# Patient Record
Sex: Male | Born: 1985 | Race: Black or African American | Hispanic: No | Marital: Single | Smoking: Never smoker
Health system: Southern US, Community
[De-identification: ages and names within clinical notes are randomized; demographics above are authoritative.]

## PROBLEM LIST (undated history)

## (undated) DIAGNOSIS — I10 Essential (primary) hypertension: Secondary | ICD-10-CM

## (undated) HISTORY — PX: FINGER SURGERY: SHX640

## (undated) HISTORY — PX: DENTAL SURGERY: SHX609

---

## 2014-09-28 ENCOUNTER — Emergency Department (HOSPITAL_COMMUNITY)
Admission: EM | Admit: 2014-09-28 | Discharge: 2014-09-28 | Disposition: A | Discharge status: Home / Self Care | Payer: BLUE CROSS/BLUE SHIELD | Attending: Emergency Medicine | Admitting: Emergency Medicine

## 2014-09-28 ENCOUNTER — Encounter (HOSPITAL_COMMUNITY): Payer: Self-pay

## 2014-09-28 ENCOUNTER — Emergency Department (HOSPITAL_COMMUNITY): Payer: BLUE CROSS/BLUE SHIELD

## 2014-09-28 DIAGNOSIS — M79671 Pain in right foot: Secondary | ICD-10-CM | POA: Diagnosis present

## 2014-09-28 DIAGNOSIS — M722 Plantar fascial fibromatosis: Secondary | ICD-10-CM | POA: Diagnosis not present

## 2014-09-28 DIAGNOSIS — Z79899 Other long term (current) drug therapy: Secondary | ICD-10-CM | POA: Insufficient documentation

## 2014-09-28 MED ORDER — NAPROXEN 500 MG PO TABS: 500.0000 mg | ORAL_TABLET | Freq: Two times a day (BID) | ORAL | Status: AC

## 2014-09-28 NOTE — Discharge Instructions (Signed)
Plantar Fasciitis (Heel Spur Syndrome) with Rehab The plantar fascia is a fibrous, ligament-like, soft-tissue structure that spans the bottom of the foot. Plantar fasciitis is a condition that causes pain in the foot due to inflammation of the tissue. SYMPTOMS   Pain and tenderness on the underneath side of the foot.  Pain that worsens with standing or walking. CAUSES  Plantar fasciitis is caused by irritation and injury to the plantar fascia on the underneath side of the foot. Common mechanisms of injury include:  Direct trauma to bottom of the foot.  Damage to a small nerve that runs under the foot where the main fascia attaches to the heel bone.  Stress placed on the plantar fascia due to bone spurs. RISK INCREASES WITH:   Activities that place stress on the plantar fascia (running, jumping, pivoting, or cutting).  Poor strength and flexibility.  Improperly fitted shoes.  Tight calf muscles.  Flat feet.  Failure to warm-up properly before activity.  Obesity. PREVENTION  Warm up and stretch properly before activity.  Allow for adequate recovery between workouts.  Maintain physical fitness:  Strength, flexibility, and endurance.  Cardiovascular fitness.  Maintain a health body weight.  Avoid stress on the plantar fascia.  Wear properly fitted shoes, including arch supports for individuals who have flat feet. PROGNOSIS  If treated properly, then the symptoms of plantar fasciitis usually resolve without surgery. However, occasionally surgery is necessary. RELATED COMPLICATIONS   Recurrent symptoms that may result in a chronic condition.  Problems of the lower back that are caused by compensating for the injury, such as limping.  Pain or weakness of the foot during push-off following surgery.  Chronic inflammation, scarring, and partial or complete fascia tear, occurring more often from repeated injections. TREATMENT  Treatment initially involves the use of  ice and medication to help reduce pain and inflammation. The use of strengthening and stretching exercises may help reduce pain with activity, especially stretches of the Achilles tendon. These exercises may be performed at home or with a therapist. Your caregiver may recommend that you use heel cups of arch supports to help reduce stress on the plantar fascia. Occasionally, corticosteroid injections are given to reduce inflammation. If symptoms persist for greater than 6 months despite non-surgical (conservative), then surgery may be recommended.  MEDICATION   If pain medication is necessary, then nonsteroidal anti-inflammatory medications, such as aspirin and ibuprofen, or other minor pain relievers, such as acetaminophen, are often recommended.  Do not take pain medication within 7 days before surgery.  Prescription pain relievers may be given if deemed necessary by your caregiver. Use only as directed and only as much as you need.  Corticosteroid injections may be given by your caregiver. These injections should be reserved for the most serious cases, because they may only be given a certain number of times. HEAT AND COLD  Cold treatment (icing) relieves pain and reduces inflammation. Cold treatment should be applied for 10 to 15 minutes every 2 to 3 hours for inflammation and pain and immediately after any activity that aggravates your symptoms. Use ice packs or massage the area with a piece of ice (ice massage).  Heat treatment may be used prior to performing the stretching and strengthening activities prescribed by your caregiver, physical therapist, or athletic trainer. Use a heat pack or soak the injury in warm water. SEEK IMMEDIATE MEDICAL CARE IF:  Treatment seems to offer no benefit, or the condition worsens.  Any medications produce adverse side effects. EXERCISES RANGE  OF MOTION (ROM) AND STRETCHING EXERCISES - Plantar Fasciitis (Heel Spur Syndrome) These exercises may help you  when beginning to rehabilitate your injury. Your symptoms may resolve with or without further involvement from your physician, physical therapist or athletic trainer. While completing these exercises, remember:   Restoring tissue flexibility helps normal motion to return to the joints. This allows healthier, less painful movement and activity.  An effective stretch should be held for at least 30 seconds.  A stretch should never be painful. You should only feel a gentle lengthening or release in the stretched tissue. RANGE OF MOTION - Toe Extension, Flexion  Sit with your right / left leg crossed over your opposite knee.  Grasp your toes and gently pull them back toward the top of your foot. You should feel a stretch on the bottom of your toes and/or foot.  Hold this stretch for __________ seconds.  Now, gently pull your toes toward the bottom of your foot. You should feel a stretch on the top of your toes and or foot.  Hold this stretch for __________ seconds. Repeat __________ times. Complete this stretch __________ times per day.  RANGE OF MOTION - Ankle Dorsiflexion, Active Assisted  Remove shoes and sit on a chair that is preferably not on a carpeted surface.  Place right / left foot under knee. Extend your opposite leg for support.  Keeping your heel down, slide your right / left foot back toward the chair until you feel a stretch at your ankle or calf. If you do not feel a stretch, slide your bottom forward to the edge of the chair, while still keeping your heel down.  Hold this stretch for __________ seconds. Repeat __________ times. Complete this stretch __________ times per day.  STRETCH - Gastroc, Standing  Place hands on wall.  Extend right / left leg, keeping the front knee somewhat bent.  Slightly point your toes inward on your back foot.  Keeping your right / left heel on the floor and your knee straight, shift your weight toward the wall, not allowing your back to  arch.  You should feel a gentle stretch in the right / left calf. Hold this position for __________ seconds. Repeat __________ times. Complete this stretch __________ times per day.

## 2014-09-28 NOTE — ED Notes (Signed)
Pt reports to ED w/ c/o of R heel pain.  Pt reports that he was playing basket ball when pain started. Pt reports that he had previous foot injury but never got it checked out. Pt denies taking anything for pain.

## 2014-09-28 NOTE — ED Provider Notes (Signed)
CSN: [id number]     Arrival date & time 09/28/14  0830 History   First MD Initiated Contact with Patient 09/28/14 [phone]     Chief Complaint  Patient presents with  . Foot Pain     (Consider location/radiation/quality/duration/timing/severity/associated sxs/prior Treatment) The history is provided by the patient.   Erik Blankenship is a 29 y.o. male presenting with acute pain in his right heel which started yesterday while playing basketball.  He describes planting his foot and had sudden onset of sharp pain in his heel which has been persistent.  He reports prior episodes of mild discomfort at the site which was self limited and resolved without difficulty or need for evaluation.  He denies weakness or numbness in his foot or toes.  He does have pain with full weightbearing on this heel.  He has applied ice to the site, has taken no medications for this condition prior to arrival.     History reviewed. No pertinent past medical history. Past Surgical History  Procedure Laterality Date  . Dental surgery    . Finger surgery     History reviewed. No pertinent family history. History  Substance Use Topics  . Smoking status: Never Smoker   . Smokeless tobacco: Not on file  . Alcohol Use: 0.6 oz/week    1 Cans of beer per week    Review of Systems  Constitutional: Negative for fever.  Musculoskeletal: Positive for arthralgias. Negative for myalgias and joint swelling.  Neurological: Negative for weakness and numbness.      Allergies  Review of patient's allergies indicates no known allergies.  Home Medications   Prior to Admission medications   Medication Sig Start Date End Date Taking? Authorizing Provider  lisinopril (PRINIVIL,ZESTRIL) 10 MG tablet Take 10 mg by mouth daily.   Yes Historical Provider, MD  naproxen (NAPROSYN) 500 MG tablet Take 1 tablet (500 mg total) by mouth 2 (two) times daily. 09/28/14   Burgess AmorJulie Viki Carrera, PA-C   BP 160/97 mmHg  Pulse 87  Temp(Src) 98.1 F (36.7  C) (Oral)  Resp 16  Ht 5\' 10"  (1.778 m)  Wt 195 lb (88.451 kg)  BMI 27.98 kg/m2  SpO2 98% Physical Exam  Constitutional: He appears well-developed and well-nourished.  HENT:  Head: Atraumatic.  Neck: Normal range of motion.  Cardiovascular:  Pulses equal bilaterally  Musculoskeletal: He exhibits tenderness.       Feet:  Tentative palpation with subtle edema noted at the medial aspect of his plantar calcaneus.  Pain is increased with flexion of his toes.  Dorsalis pedis pulses are intact, no other foot edema noted.  Less than 2 second cap refill in toes.  Achilles tendon is nontender and without disruption.  Neurological: He is alert. He has normal strength. He displays normal reflexes. No sensory deficit.  Skin: Skin is warm and dry.  Psychiatric: He has a normal mood and affect.    ED Course  Procedures (including critical care time) Labs Review Labs Reviewed - No data to display  Imaging Review Dg Foot Complete Right  09/28/2014   CLINICAL DATA:  Right foot injury playing basketball 09/27/2014. Pain. Initial encounter.  EXAM: RIGHT FOOT COMPLETE - 3+ VIEW  COMPARISON:  None.  FINDINGS: Imaged bones, joints and soft tissues appear normal.  IMPRESSION: Negative exam.   Electronically Signed   By: Drusilla Kannerhomas  Dalessio M.D.   On: 09/28/2014 09:10     EKG Interpretation None      MDM   Final diagnoses:  Plantar fasciitis of right foot    Patients labs and/or radiological studies were reviewed and considered during the medical decision making and disposition process.  Results were also discussed with patient. Patient was prescribed naproxen, discussed ice for the next several days, then can employ contrast baths.  He was given instructions on stretching exercises to help eliminate this problem.  Advised to purchase a shoe insert designed for this condition to take the pressure off of the site.  He was given referral to Dr. Romeo Apple for further evaluation if today's treatment does  not improve his symptoms.  The patient appears reasonably screened and/or stabilized for discharge and I doubt any other medical condition or other [facility] requiring further screening, evaluation, or treatment in the ED at this time prior to discharge.     Burgess Amor, PA-C 09/28/14 8119  Donnetta Hutching, MD 09/28/14 1031

## 2014-09-28 NOTE — ED Notes (Signed)
Patient with no complaints at this time. Respirations even and unlabored. Skin warm/dry. Discharge instructions reviewed with patient at this time. Patient given opportunity to voice concerns/ask questions. Patient discharged at this time and left Emergency Department with steady gait.   

## 2014-11-09 ENCOUNTER — Emergency Department (HOSPITAL_COMMUNITY)
Admission: EM | Admit: 2014-11-09 | Discharge: 2014-11-09 | Disposition: A | Discharge status: Home / Self Care | Payer: BLUE CROSS/BLUE SHIELD | Attending: Emergency Medicine | Admitting: Emergency Medicine

## 2014-11-09 ENCOUNTER — Encounter (HOSPITAL_COMMUNITY): Payer: Self-pay | Admitting: *Deleted

## 2014-11-09 DIAGNOSIS — Z79899 Other long term (current) drug therapy: Secondary | ICD-10-CM | POA: Diagnosis not present

## 2014-11-09 DIAGNOSIS — J039 Acute tonsillitis, unspecified: Secondary | ICD-10-CM | POA: Diagnosis not present

## 2014-11-09 DIAGNOSIS — I10 Essential (primary) hypertension: Secondary | ICD-10-CM | POA: Insufficient documentation

## 2014-11-09 DIAGNOSIS — Z791 Long term (current) use of non-steroidal anti-inflammatories (NSAID): Secondary | ICD-10-CM | POA: Diagnosis not present

## 2014-11-09 DIAGNOSIS — J029 Acute pharyngitis, unspecified: Secondary | ICD-10-CM | POA: Diagnosis present

## 2014-11-09 HISTORY — DX: Essential (primary) hypertension: I10

## 2014-11-09 MED ORDER — MAGIC MOUTHWASH W/LIDOCAINE: 5.0000 mL | Freq: Three times a day (TID) | ORAL | Status: AC | PRN

## 2014-11-09 MED ORDER — AMOXICILLIN 500 MG PO CAPS: 500.0000 mg | ORAL_CAPSULE | Freq: Three times a day (TID) | ORAL | Status: AC

## 2014-11-09 NOTE — Discharge Instructions (Signed)
Tonsillitis °Tonsillitis is an infection of the throat. This infection causes the tonsils to become red, tender, and puffy (swollen). Tonsils are groups of tissue at the back of your throat. If bacteria caused your infection, antibiotic medicine will be given to you. Sometimes symptoms of tonsillitis can be relieved with the use of steroid medicine. If your tonsillitis is severe and happens often, you may need to get your tonsils removed (tonsillectomy). °HOME CARE  °· Rest and sleep often. °· Drink enough fluids to keep your pee (urine) clear or pale yellow. °· While your throat is sore, eat soft or liquid foods like: °¨ Soup. °¨ Ice cream. °¨ Instant breakfast drinks. °· Eat frozen ice pops. °· Gargle with a warm or cold liquid to help soothe the throat. Gargle with a water and salt mix. Mix 1/4 teaspoon of salt and 1/4 teaspoon of baking soda in 1 cup of water. °· Only take medicines as told by your doctor. °· If you are given medicines (antibiotics), take them as told. Finish them even if you start to feel better. °GET HELP IF: °· You have large, tender lumps in your neck. °· You have a rash. °· You cough up green, yellow-brown, or bloody fluid. °· You cannot swallow liquids or food for 24 hours. °· You notice that only one of your tonsils is swollen. °GET HELP RIGHT AWAY IF:  °· You throw up (vomit). °· You have a very bad headache. °· You have a stiff neck. °· You have chest pain. °· You have trouble breathing or swallowing. °· You have bad throat pain, drooling, or your voice changes. °· You have bad pain not helped by medicine. °· You cannot fully open your mouth. °· You have redness, puffiness, or bad pain in the neck. °· You have a fever. °MAKE SURE YOU:  °· Understand these instructions. °· Will watch your condition. °· Will get help right away if you are not doing well or get worse. °Document Released: 11/26/2007 Document Revised: 06/14/2013 Document Reviewed: 11/26/2012 °[employer]® Patient Information  ©2015 [employer]. This information is not intended to replace advice given to you by your health care provider. Make sure you discuss any questions you have with your health care provider. ° °

## 2014-11-09 NOTE — ED Provider Notes (Signed)
CSN: [id number]     Arrival date & time 11/09/14  1125 History   First MD Initiated Contact with Patient 11/09/14 1139     Chief Complaint  Patient presents with  . Sore Throat     (Consider location/radiation/quality/duration/timing/severity/associated sxs/prior Treatment) HPI   Erik Blankenship is a 29 y.o. male who presents to the Emergency Department complaining of sore throat for 2 weeks.  He states he developed "cold symptoms" initially, but later congestion and runny nose improved after a week , but sore throat continues.  He has used OTC throat spray.  He denies fever, neck pain, ear pain or vomiting.     Past Medical History  Diagnosis Date  . Hypertension    Past Surgical History  Procedure Laterality Date  . Dental surgery    . Finger surgery     History reviewed. No pertinent family history. History  Substance Use Topics  . Smoking status: Never Smoker   . Smokeless tobacco: Not on file  . Alcohol Use: 0.6 oz/week    1 Cans of beer per week    Review of Systems  Constitutional: Negative for fever, chills, activity change and appetite change.  HENT: Positive for sore throat. Negative for congestion, ear pain, facial swelling, trouble swallowing and voice change.   Eyes: Negative for pain and visual disturbance.  Respiratory: Negative for cough and shortness of breath.   Gastrointestinal: Negative for nausea, vomiting and abdominal pain.  Musculoskeletal: Negative for arthralgias, neck pain and neck stiffness.  Skin: Negative for color change and rash.  Neurological: Negative for dizziness, facial asymmetry, speech difficulty, numbness and headaches.  Hematological: Negative for adenopathy.  All other systems reviewed and are negative.     Allergies  Review of patient's allergies indicates no known allergies.  Home Medications   Prior to Admission medications   Medication Sig Start Date End Date Taking? Authorizing Provider  Alum & Mag Hydroxide-Simeth  (MAGIC MOUTHWASH W/LIDOCAINE) SOLN Take 5 mLs by mouth 3 (three) times daily as needed for mouth pain. Swish and spit, do not swallow 11/09/14   Eileene Kisling, PA-C  amoxicillin (AMOXIL) 500 MG capsule Take 1 capsule (500 mg total) by mouth 3 (three) times daily. For 10 days 11/09/14   Agripina Guyette, PA-C  lisinopril (PRINIVIL,ZESTRIL) 10 MG tablet Take 10 mg by mouth daily.    Historical Provider, MD  naproxen (NAPROSYN) 500 MG tablet Take 1 tablet (500 mg total) by mouth 2 (two) times daily. 09/28/14   Burgess AmorJulie Idol, PA-C   BP 162/104 mmHg  Pulse 77  Temp(Src) 97.8 F (36.6 C) (Oral)  Resp 15  Ht 5\' 10"  (1.778 m)  Wt 195 lb (88.451 kg)  BMI 27.98 kg/m2  SpO2 100% Physical Exam  Constitutional: He is oriented to person, place, and time. He appears well-developed and well-nourished. No distress.  HENT:  Head: Normocephalic and atraumatic.  Right Ear: Tympanic membrane and ear canal normal.  Left Ear: Tympanic membrane and ear canal normal.  Mouth/Throat: Uvula is midline and mucous membranes are normal. No trismus in the jaw. No uvula swelling. Oropharyngeal exudate, posterior oropharyngeal edema and posterior oropharyngeal erythema present. No tonsillar abscesses.  Neck: Normal range of motion. Neck supple.  Cardiovascular: Normal rate, regular rhythm and normal heart sounds.   Pulmonary/Chest: Effort normal and breath sounds normal.  Abdominal: There is no splenomegaly. There is no tenderness.  Musculoskeletal: Normal range of motion.  Lymphadenopathy:    He has no cervical adenopathy.  Neurological: He is  alert and oriented to person, place, and time. He exhibits normal muscle tone. Coordination normal.  Skin: Skin is warm and dry.  Nursing note and vitals reviewed.   ED Course  Procedures (including critical care time) Labs Review Labs Reviewed - No data to display  Imaging Review No results found.   EKG Interpretation None      MDM   Final diagnoses:  Tonsillitis     Pt is well appearing.  Non-toxic.  BP improved on recheck, but still hypertensive.  No sx's.  Pt did not take his BP medication this morning advised to take it when he gets home.  Agrees to return for any worsening sx's  Pauline Ausammy Jihan Mellette, PA-C 11/11/14 1605  Rolland PorterMark James, MD 11/18/14 [phone]

## 2014-11-09 NOTE — ED Notes (Signed)
Sore throat for 2 weeks,

## 2015-08-24 IMAGING — DX DG FOOT COMPLETE 3+V*R*
3 series · 3 of 3 positions shown · non-contrast
Comparison: None.

CLINICAL DATA: Right foot injury playing basketball 09/27/2014.
Pain. Initial encounter.

EXAM:
RIGHT FOOT COMPLETE - 3+ VIEW

[foot ap]
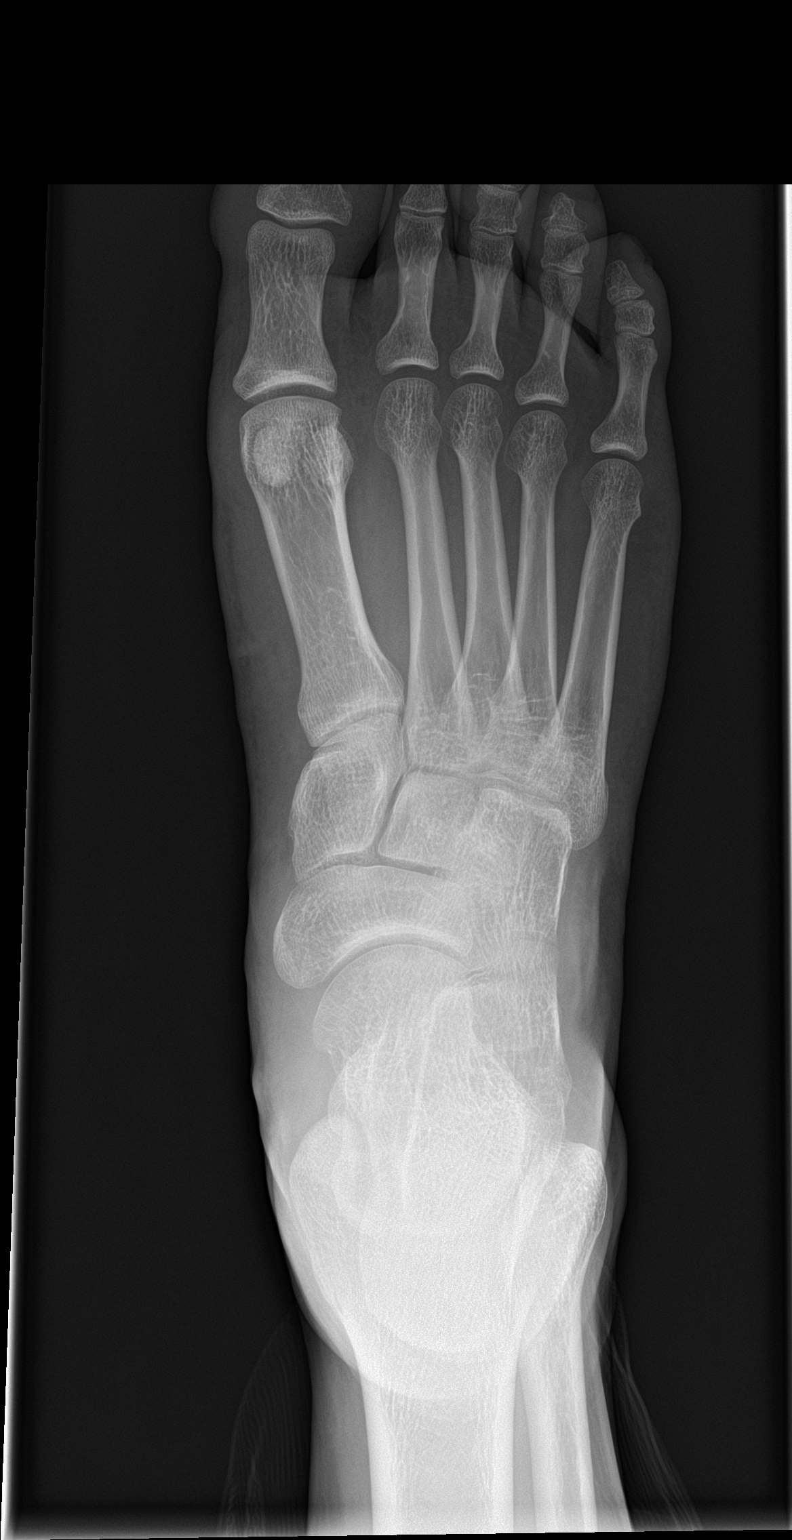

[foot obl]
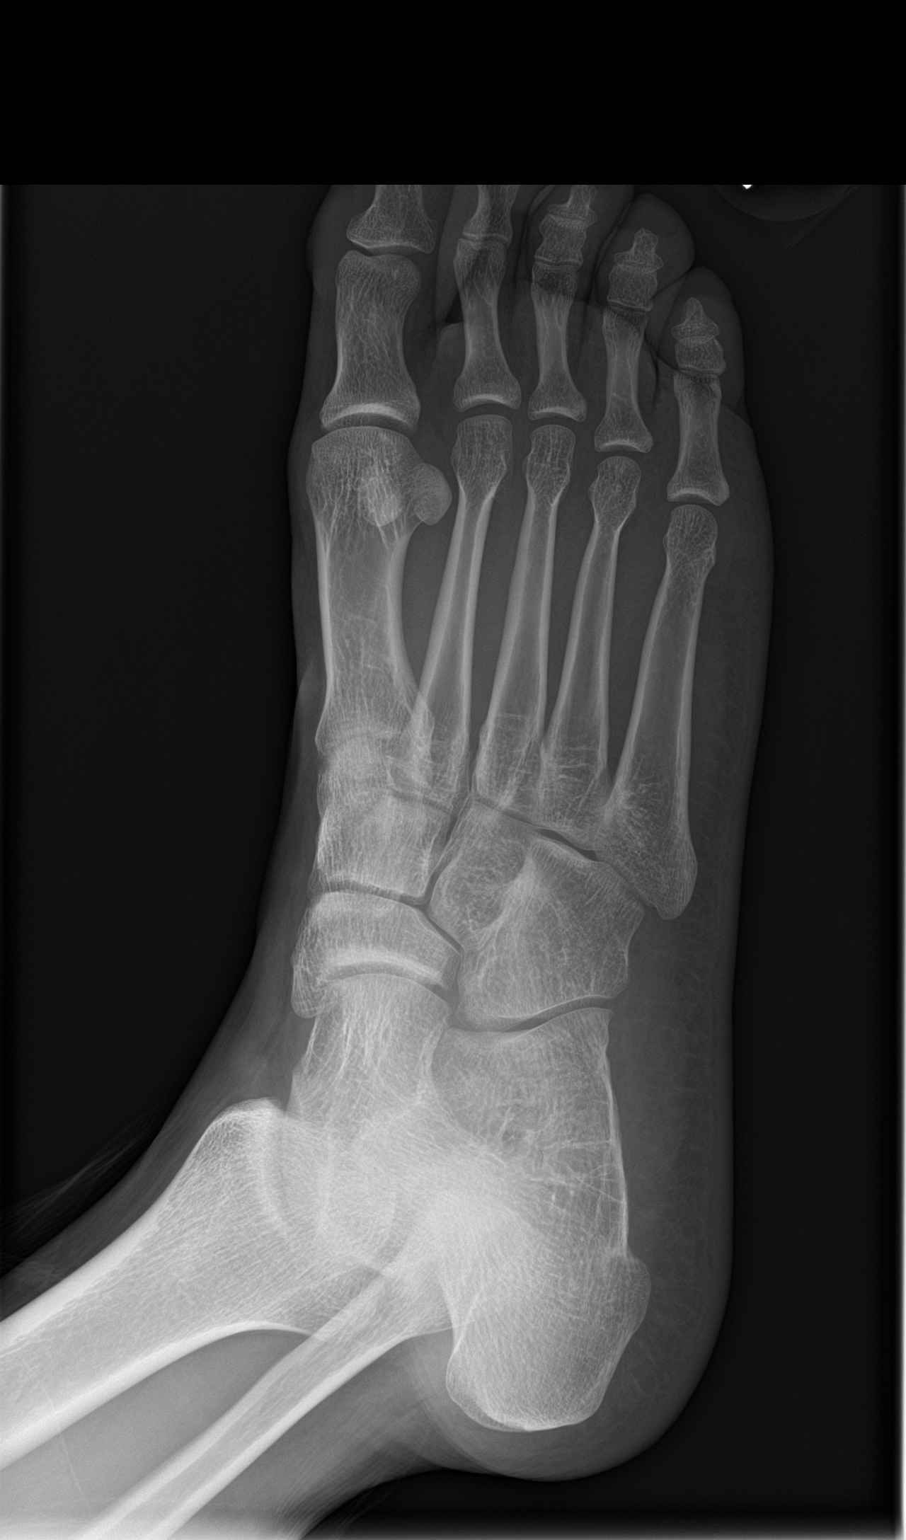

[foot lat]
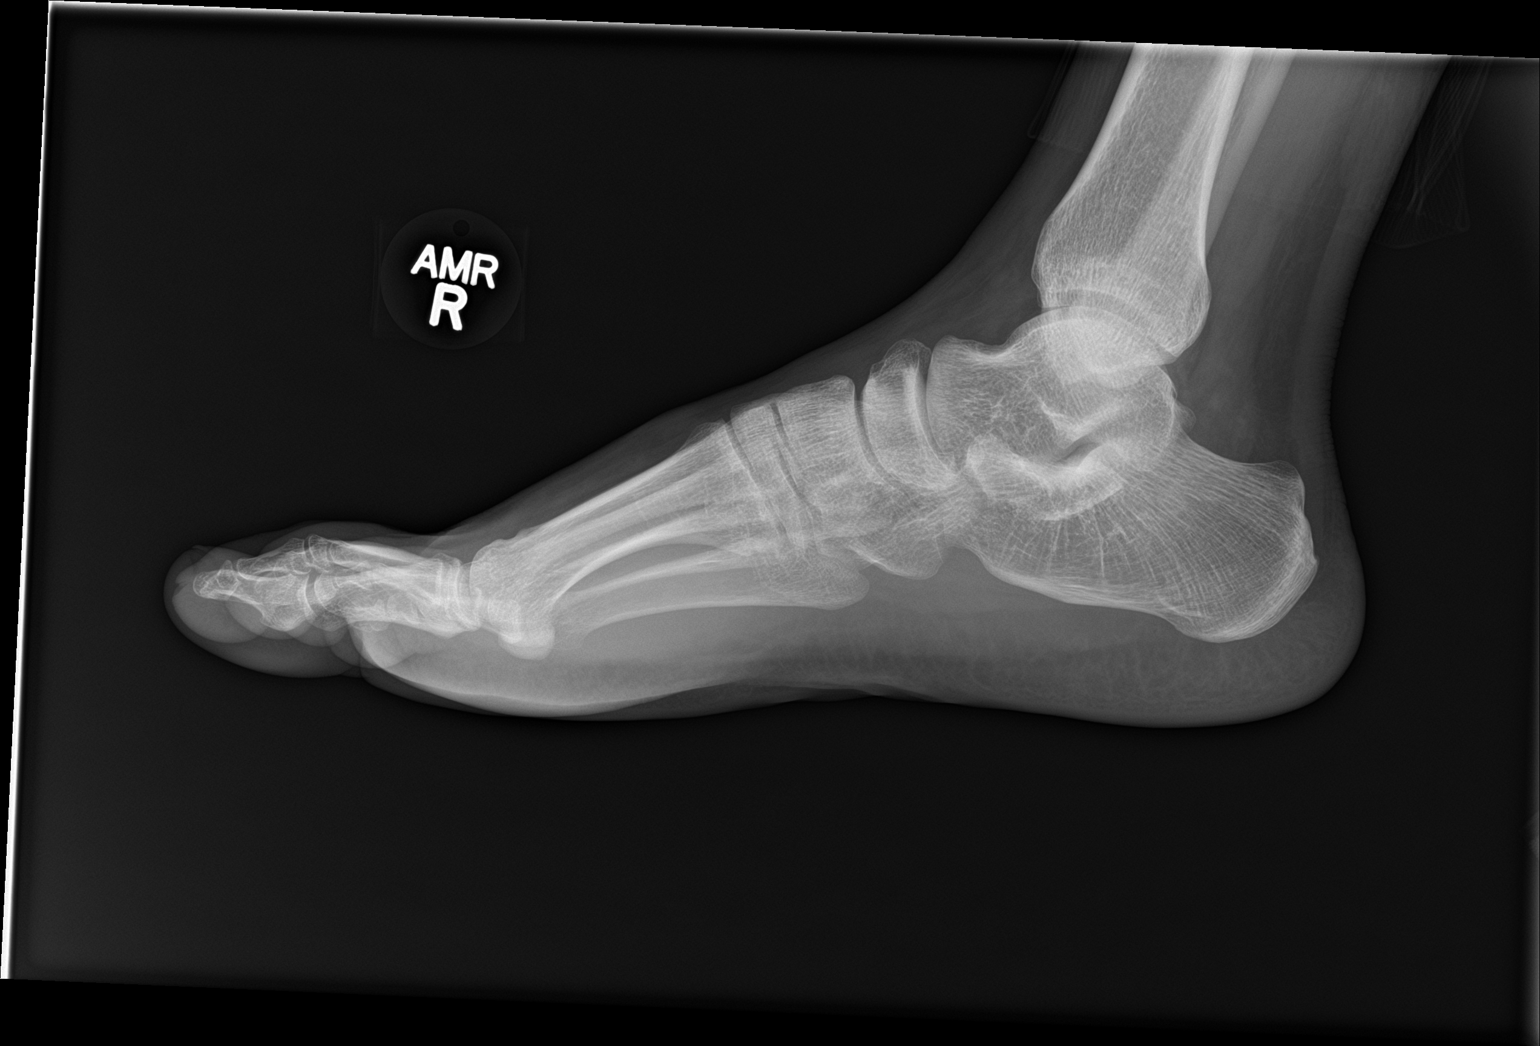

[3 of 3 positions shown; findings below may reference images not displayed]

FINDINGS: Imaged bones, joints and soft tissues appear normal.
IMPRESSION: Negative exam.

## 2015-11-18 ENCOUNTER — Emergency Department (HOSPITAL_COMMUNITY): Payer: BLUE CROSS/BLUE SHIELD

## 2015-11-18 ENCOUNTER — Emergency Department (HOSPITAL_COMMUNITY)
Admission: EM | Admit: 2015-11-18 | Discharge: 2015-11-18 | Disposition: A | Discharge status: Home / Self Care | Payer: BLUE CROSS/BLUE SHIELD | Attending: Emergency Medicine | Admitting: Emergency Medicine

## 2015-11-18 ENCOUNTER — Encounter (HOSPITAL_COMMUNITY): Payer: Self-pay | Admitting: Emergency Medicine

## 2015-11-18 DIAGNOSIS — J0100 Acute maxillary sinusitis, unspecified: Secondary | ICD-10-CM | POA: Insufficient documentation

## 2015-11-18 DIAGNOSIS — I10 Essential (primary) hypertension: Secondary | ICD-10-CM | POA: Insufficient documentation

## 2015-11-18 DIAGNOSIS — R51 Headache: Secondary | ICD-10-CM | POA: Diagnosis present

## 2015-11-18 DIAGNOSIS — J189 Pneumonia, unspecified organism: Secondary | ICD-10-CM | POA: Diagnosis not present

## 2015-11-18 DIAGNOSIS — Z79899 Other long term (current) drug therapy: Secondary | ICD-10-CM | POA: Insufficient documentation

## 2015-11-18 DIAGNOSIS — Z792 Long term (current) use of antibiotics: Secondary | ICD-10-CM | POA: Insufficient documentation

## 2015-11-18 MED ORDER — BENZONATATE 100 MG PO CAPS: 200.0000 mg | ORAL_CAPSULE | Freq: Three times a day (TID) | ORAL | Status: AC | PRN

## 2015-11-18 MED ORDER — BENZONATATE 100 MG PO CAPS
200.0000 mg | ORAL_CAPSULE | Freq: Once | ORAL | Status: AC
Start: 2015-11-18 — End: 2015-11-18
  Administered 2015-11-18: 200 mg via ORAL
  Filled 2015-11-18: qty 2

## 2015-11-18 MED ORDER — LEVOFLOXACIN 750 MG PO TABS
750.0000 mg | ORAL_TABLET | Freq: Once | ORAL | Status: AC
  Administered 2015-11-18: 750 mg via ORAL
  Filled 2015-11-18: qty 1

## 2015-11-18 MED ORDER — GUAIFENESIN-CODEINE 100-10 MG/5ML PO SOLN: 10.0000 mL | Freq: Four times a day (QID) | ORAL | Status: AC | PRN

## 2015-11-18 MED ORDER — LEVOFLOXACIN 750 MG PO TABS: 750.0000 mg | ORAL_TABLET | Freq: Every day | ORAL | Status: AC

## 2015-11-18 MED ORDER — ALBUTEROL SULFATE HFA 108 (90 BASE) MCG/ACT IN AERS
2.0000 | INHALATION_SPRAY | RESPIRATORY_TRACT | Status: DC | PRN
  Administered 2015-11-18: 2 via RESPIRATORY_TRACT
  Filled 2015-11-18: qty 6.7

## 2015-11-18 NOTE — Discharge Instructions (Signed)

## 2015-11-18 NOTE — ED Notes (Signed)
Pt reports facial pain, sore throat, fatigue and fever for the past two days.

## 2015-11-18 NOTE — ED Provider Notes (Signed)
CSN: [id number]     Arrival date & time 11/18/15  0747 History   First MD Initiated Contact with Patient 11/18/15 [phone]     Chief Complaint  Patient presents with  . Facial Pain     (Consider location/radiation/quality/duration/timing/severity/associated sxs/prior Treatment) The history is provided by the patient.   Cathrine MusterKeyon Blankenship is a 30 y.o. male presenting with a 3 day history of uri type symptoms including yellow nasal discharge with nasal congestion, post nasal drip, sore throat and with increasing bilateral facial pain.  In addition, he has had increasing cough with mid sternal burning pain with cough, sometimes productive also of similar yellow sputum. He endorses low grade fever to 100. Degrees yesterday. He has taken dayquill and nyquill without significant improvement.  He reports generalized fatigue but denies sob, dizziness, nausea, vomiting, abdominal pain or back pain.    Past Medical History  Diagnosis Date  . Hypertension    Past Surgical History  Procedure Laterality Date  . Dental surgery    . Finger surgery     History reviewed. No pertinent family history. Social History  Substance Use Topics  . Smoking status: Never Smoker   . Smokeless tobacco: None  . Alcohol Use: 0.6 oz/week    1 Cans of beer per week    Review of Systems  Constitutional: Positive for fever. Negative for chills.  HENT: Positive for congestion, rhinorrhea, sinus pressure and sore throat. Negative for ear pain, facial swelling, trouble swallowing and voice change.   Eyes: Negative for discharge.  Respiratory: Positive for cough. Negative for shortness of breath, wheezing and stridor.   Cardiovascular: Negative for chest pain.  Gastrointestinal: Negative for abdominal pain.  Genitourinary: Negative.       Allergies  Review of patient's allergies indicates no known allergies.  Home Medications   Prior to Admission medications   Medication Sig Start Date End Date Taking?  Authorizing Provider  Alum & Mag Hydroxide-Simeth (MAGIC MOUTHWASH W/LIDOCAINE) SOLN Take 5 mLs by mouth 3 (three) times daily as needed for mouth pain. Swish and spit, do not swallow 11/09/14   Tammy Triplett, PA-C  amoxicillin (AMOXIL) 500 MG capsule Take 1 capsule (500 mg total) by mouth 3 (three) times daily. For 10 days 11/09/14   Tammy Triplett, PA-C  benzonatate (TESSALON) 100 MG capsule Take 2 capsules (200 mg total) by mouth 3 (three) times daily as needed. 11/18/15   Burgess AmorJulie Jeron Grahn, PA-C  guaiFENesin-codeine 100-10 MG/5ML syrup Take 10 mLs by mouth every 6 (six) hours as needed for cough. 11/18/15   Burgess AmorJulie Hildegard Hlavac, PA-C  levofloxacin (LEVAQUIN) 750 MG tablet Take 1 tablet (750 mg total) by mouth daily. 11/18/15   Burgess AmorJulie [place] Doig, PA-C  lisinopril (PRINIVIL,ZESTRIL) 10 MG tablet Take 10 mg by mouth daily.    Historical Provider, MD  naproxen (NAPROSYN) 500 MG tablet Take 1 tablet (500 mg total) by mouth 2 (two) times daily. 09/28/14   Burgess AmorJulie Addilee Neu, PA-C   BP 134/93 mmHg  Pulse 105  Temp(Src) 98.6 F (37 C) (Oral)  Resp 16  Ht 5\' 10"  (1.778 m)  Wt 89.812 kg  BMI 28.41 kg/m2  SpO2 95% Physical Exam  Constitutional: He is oriented to person, place, and time. He appears well-developed and well-nourished.  HENT:  Head: Normocephalic and atraumatic.  Right Ear: Tympanic membrane and ear canal normal.  Left Ear: Tympanic membrane and ear canal normal.  Nose: Mucosal edema and rhinorrhea present.  Mouth/Throat: Uvula is midline and mucous membranes are normal. Posterior oropharyngeal  erythema present. No oropharyngeal exudate, posterior oropharyngeal edema or tonsillar abscesses.  Eyes: Conjunctivae are normal.  Cardiovascular: Normal rate and normal heart sounds.   Pulmonary/Chest: Effort normal. No respiratory distress. He has decreased breath sounds in the right middle field and the right lower field. He has no wheezes. He has no rhonchi. He has no rales.  Abdominal: Soft. There is no tenderness.   Musculoskeletal: Normal range of motion.  Neurological: He is alert and oriented to person, place, and time.  Skin: Skin is warm and dry. No rash noted.  Psychiatric: He has a normal mood and affect.    ED Course  Procedures (including critical care time) Labs Review Labs Reviewed - No data to display  Imaging Review Dg Chest 2 View  11/18/2015  CLINICAL DATA:  Cough, fever, chest pain. EXAM: CHEST  2 VIEW COMPARISON:  None. FINDINGS: The heart size and mediastinal contours are within normal limits. Right lung is clear. Mild left basilar atelectasis or infiltrate is noted. No pneumothorax or pleural effusion is noted. The visualized skeletal structures are unremarkable. IMPRESSION: Mild left basilar atelectasis or infiltrate is present. Electronically Signed   By: Lupita Raider, M.D.   On: 11/18/2015 08:54   I have personally reviewed and evaluated these images and lab results as part of my medical decision-making.   EKG Interpretation None      MDM   Final diagnoses:  Acute maxillary sinusitis, recurrence not specified  Community acquired pneumonia    Pt placed on levaquin, first dose given here, given albuterol mdi for q 4 hour use prn cough or sob. Tessalon, robitussin ac.  Rest, f/u with pcp in one week, sooner for any worsened sx.    Burgess Amor, PA-C 11/18/15 1191  Bethann Berkshire, MD 11/18/15 1016

## 2015-11-18 NOTE — Progress Notes (Signed)
Teach back method done with pt on use of Albuterol inhaler. Pt is aware of dosage and frequency, and demonstrated correctly how to use inhaler.

## 2016-10-13 IMAGING — DX DG CHEST 2V
2 series · 2 of 2 positions shown · non-contrast
Comparison: None.

CLINICAL DATA: Cough, fever, chest pain.

EXAM:
CHEST  2 VIEW

[chest pa]
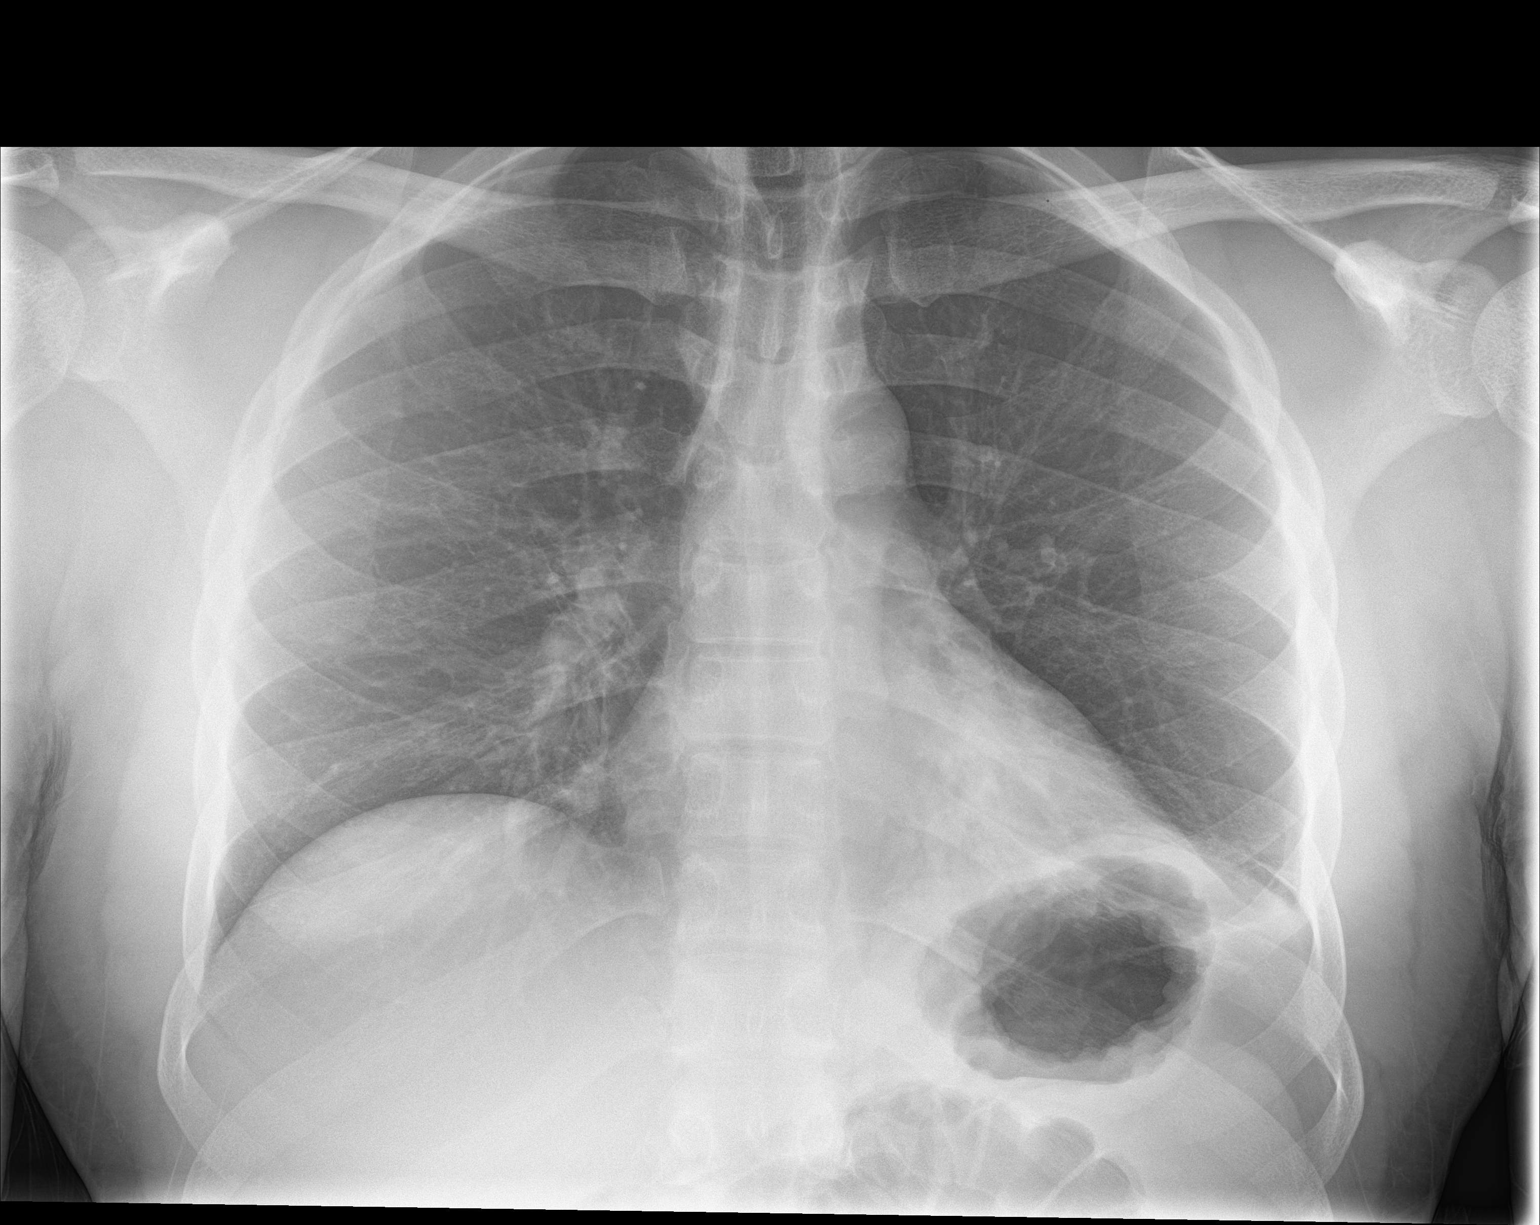

[chest lat]
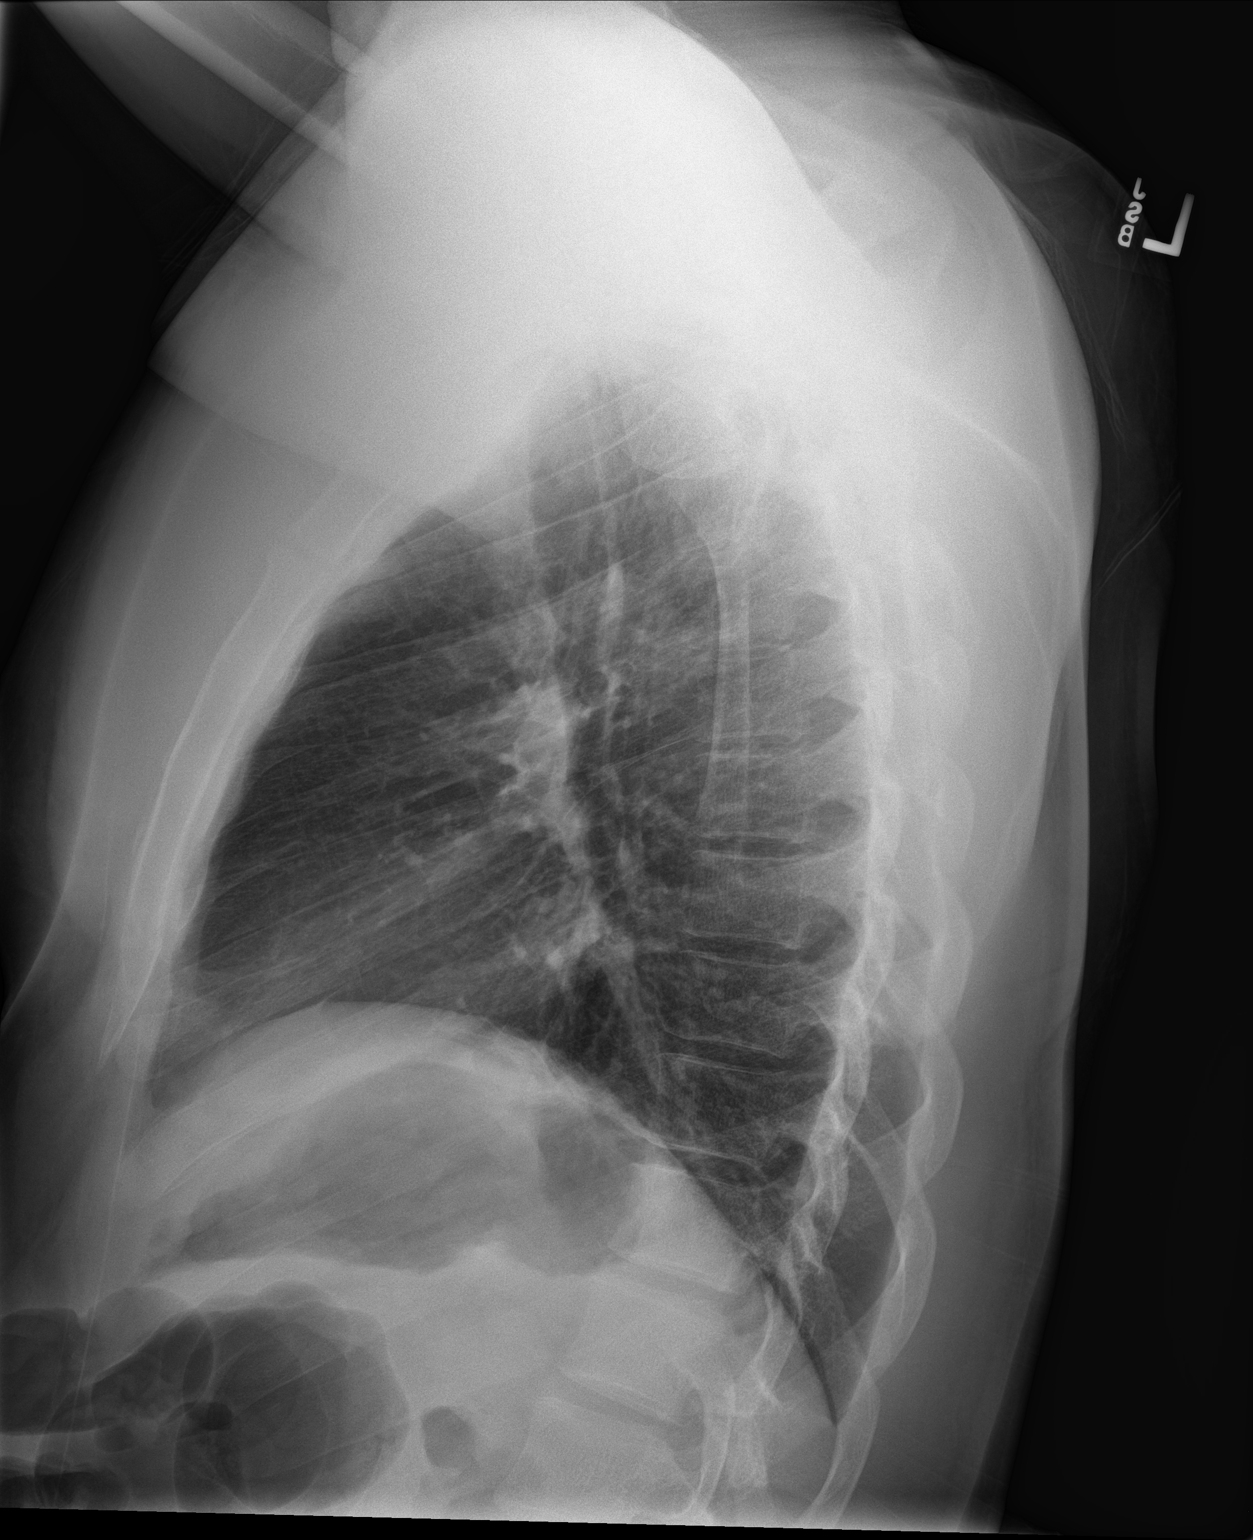

[2 of 2 positions shown; findings below may reference images not displayed]

FINDINGS: The heart size and mediastinal contours are within normal limits.
Right lung is clear. Mild left basilar atelectasis or infiltrate is
noted. No pneumothorax or pleural effusion is noted. The visualized
skeletal structures are unremarkable.
IMPRESSION: Mild left basilar atelectasis or infiltrate is present.
